# Patient Record
Sex: Female | Born: 1990 | Race: Black or African American | Hispanic: No | Marital: Single | Smoking: Never smoker
Health system: Southern US, Community
[De-identification: ages and names within clinical notes are randomized; demographics above are authoritative.]

## PROBLEM LIST (undated history)

## (undated) ENCOUNTER — Emergency Department (HOSPITAL_COMMUNITY): Admission: EM | Payer: 59 | Source: Home / Self Care

---

## 2015-10-18 ENCOUNTER — Encounter: Payer: Self-pay | Admitting: *Deleted

## 2015-11-18 ENCOUNTER — Ambulatory Visit: Payer: Self-pay | Admitting: Obstetrics & Gynecology

## 2016-12-09 ENCOUNTER — Emergency Department (HOSPITAL_COMMUNITY)
Admission: EM | Admit: 2016-12-09 | Discharge: 2016-12-09 | Disposition: A | Discharge status: Home / Self Care | Payer: 59 | Attending: Emergency Medicine | Admitting: Emergency Medicine

## 2016-12-09 ENCOUNTER — Encounter (HOSPITAL_COMMUNITY): Payer: Self-pay | Admitting: Emergency Medicine

## 2016-12-09 DIAGNOSIS — L02415 Cutaneous abscess of right lower limb: Secondary | ICD-10-CM | POA: Insufficient documentation

## 2016-12-09 DIAGNOSIS — L03115 Cellulitis of right lower limb: Secondary | ICD-10-CM

## 2016-12-09 DIAGNOSIS — L0291 Cutaneous abscess, unspecified: Secondary | ICD-10-CM

## 2016-12-09 LAB — CBC WITH DIFFERENTIAL/PLATELET
BASOS PCT: 0 %
Basophils Absolute: 0 10*3/uL (ref 0.0–0.1)
EOS ABS: 0.1 10*3/uL (ref 0.0–0.7)
Eosinophils Relative: 1 %
HCT: 35.9 % — ABNORMAL LOW (ref 36.0–46.0)
HEMOGLOBIN: 11.5 g/dL — AB (ref 12.0–15.0)
Lymphocytes Relative: 26 %
Lymphs Abs: 3.1 10*3/uL (ref 0.7–4.0)
MCH: 27.5 pg (ref 26.0–34.0)
MCHC: 32 g/dL (ref 30.0–36.0)
MCV: 85.9 fL (ref 78.0–100.0)
MONOS PCT: 9 %
Monocytes Absolute: 1 10*3/uL (ref 0.1–1.0)
NEUTROS PCT: 64 %
Neutro Abs: 7.7 10*3/uL (ref 1.7–7.7)
Platelets: 368 10*3/uL (ref 150–400)
RBC: 4.18 MIL/uL (ref 3.87–5.11)
RDW: 13.8 % (ref 11.5–15.5)
WBC: 12 10*3/uL — AB (ref 4.0–10.5)

## 2016-12-09 LAB — COMPREHENSIVE METABOLIC PANEL
ALBUMIN: 3.4 g/dL — AB (ref 3.5–5.0)
ALK PHOS: 46 U/L (ref 38–126)
ALT: 16 U/L (ref 14–54)
ANION GAP: 6 (ref 5–15)
AST: 17 U/L (ref 15–41)
BUN: 7 mg/dL (ref 6–20)
CO2: 25 mmol/L (ref 22–32)
Calcium: 9 mg/dL (ref 8.9–10.3)
Chloride: 106 mmol/L (ref 101–111)
Creatinine, Ser: 0.69 mg/dL (ref 0.44–1.00)
GFR calc Af Amer: >60 mL/min (ref >60)
GFR calc non Af Amer: >60 mL/min (ref >60)
GLUCOSE: 104 mg/dL — AB (ref 65–99)
POTASSIUM: 4.1 mmol/L (ref 3.5–5.1)
SODIUM: 137 mmol/L (ref 135–145)
Total Bilirubin: 0.3 mg/dL (ref 0.3–1.2)
Total Protein: 7.4 g/dL (ref 6.5–8.1)

## 2016-12-09 LAB — I-STAT BETA HCG BLOOD, ED (MC, WL, AP ONLY): I-stat hCG, quantitative: <5 m[IU]/mL (ref <5)

## 2016-12-09 MED ORDER — OXYCODONE-ACETAMINOPHEN 5-325 MG PO TABS
2.0000 | ORAL_TABLET | Freq: Once | ORAL | Status: DC
  Filled 2016-12-09: qty 2

## 2016-12-09 MED ORDER — OXYCODONE HCL 5 MG PO TABS
10.0000 mg | ORAL_TABLET | Freq: Once | ORAL | Status: AC
  Administered 2016-12-09: 10 mg via ORAL
  Filled 2016-12-09 (×2): qty 2

## 2016-12-09 MED ORDER — LIDOCAINE HCL (PF) 1 % IJ SOLN
2.0000 mL | Freq: Once | INTRAMUSCULAR | Status: AC
  Administered 2016-12-09: 2 mL
  Filled 2016-12-09: qty 5

## 2016-12-09 NOTE — ED Triage Notes (Signed)
Pt. reports right lateral  thigh pain with swelling from an insect bite onset last Friday .

## 2016-12-09 NOTE — Discharge Instructions (Signed)
You had an abscess in your right thigh.  This was opened and drained.  You should not eat any further treatment or intervention. As discussed please apply warm compress or stand in the shower and massage the area vigorously 2-3 times a day for the next 2-3 days, you can expect some bloody drainage for the next 24-48 hours.

## 2016-12-09 NOTE — ED Provider Notes (Signed)
[facility] Provider Note   CSN: [id number] Arrival date & time: 12/09/16  0034     History   Chief Complaint Chief Complaint  Patient presents with  . Cellulitis    HPI Erica Wagner is a 26 y.o. female.  This 26 year old female who reports that she went to the [place] last week, returning on Friday.  Since that time she has had a red sore area on her anterior right thigh that is getting worse.  She thinks she may of been bitten by something while she was on vacation.  Denies any generalized, fever, body aches.  States that she's been using Tylenol, ibuprofen for discomfort with no relief and applying ice packs      History reviewed. No pertinent past medical history.  There are no active problems to display for this patient.   History reviewed. No pertinent surgical history.  OB History    No data available       Home Medications    Prior to Admission medications   Not on File    Family History No family history on file.  Social History Social History  Substance Use Topics  . Smoking status: Never Smoker  . Smokeless tobacco: Never Used  . Alcohol use No     Allergies   Patient has no allergy information on record.   Review of Systems Review of Systems  Constitutional: Negative for chills and fever.  Skin: Positive for color change and wound.  All other systems reviewed and are negative.    Physical Exam Updated Vital Signs BP 139/96 (BP Location: Right Arm)   Pulse 103   Temp 98.1 F (36.7 C) (Oral)   Resp 18   Ht 5\' 6"  (1.676 m)   Wt 113.4 kg   LMP 11/17/2016 (Approximate)   SpO2 98%   BMI 40.35 kg/m   Physical Exam  Constitutional: She appears well-developed and well-nourished.  HENT:  Head: Normocephalic.  Eyes: Pupils are equal, round, and reactive to light.  Neck: Normal range of motion.  Cardiovascular: Normal rate.   Pulmonary/Chest: Effort normal.  Abdominal: Soft.  Musculoskeletal: Normal  range of motion. She exhibits tenderness.       Legs: Neurological: She is alert.  Skin: Skin is warm.  Psychiatric: She has a normal mood and affect.  Nursing note and vitals reviewed.    ED Treatments / Results  Labs (all labs ordered are listed, but only abnormal results are displayed) Labs Reviewed  CBC WITH DIFFERENTIAL/PLATELET - Abnormal; Notable for the following:       Result Value   WBC 12.0 (*)    Hemoglobin 11.5 (*)    HCT 35.9 (*)    All other components within normal limits  COMPREHENSIVE METABOLIC PANEL - Abnormal; Notable for the following:    Glucose, Bld 104 (*)    Albumin 3.4 (*)    All other components within normal limits  I-STAT BETA HCG BLOOD, ED ([facility], [facility], [facility] ONLY)    EKG  EKG Interpretation None       Radiology No results found.  Procedures .Marland KitchenIncision and Drainage Date/Time: 12/09/2016 3:43 AM Performed by: Earley Favor Authorized by: Earley Favor   Consent:    Consent obtained:  Verbal   Consent given by:  Patient   Risks discussed:  Bleeding, pain and infection   Alternatives discussed:  No treatment Location:    Type:  Abscess   Size:  6 cm   Location:  Lower extremity  Lower extremity location:  Leg   Leg location:  R upper leg Pre-procedure details:    Skin preparation:  Betadine Anesthesia (see MAR for exact dosages):    Anesthesia method:  Local infiltration   Local anesthetic:  Lidocaine 1% w/o epi Procedure type:    Complexity:  Simple Procedure details:    Needle aspiration: no     Incision types:  Single straight   Incision depth:  Subcutaneous   Scalpel blade:  11   Wound management:  Probed and deloculated   Drainage:  Purulent   Drainage amount:  Moderate   Wound treatment:  Wound left open   Packing materials:  None Post-procedure details:    Patient tolerance of procedure:  Tolerated well, no immediate complications   (including critical care time)  Medications Ordered in ED Medications  lidocaine (PF)  (XYLOCAINE) 1 % injection 2 mL (2 mLs Infiltration Given 12/09/16 0246)  oxyCODONE (Oxy IR/ROXICODONE) immediate release tablet 10 mg (10 mg Oral Given 12/09/16 0247)     Initial Impression / Assessment and Plan / ED Course  I have reviewed the triage vital signs and the nursing notes.  Pertinent labs & imaging results that were available during my care of the patient were reviewed by me and considered in my medical decision making (see chart for details).  Clinical Course     I&D performed  Final Clinical Impressions(s) / ED Diagnoses   Final diagnoses:  Cellulitis of right leg  Abscess    New Prescriptions New Prescriptions   No medications on file     Earley FavorGail Roosevelt Bisher, NP 12/09/16 0344    Earley FavorGail Naydelin Ziegler, NP 12/09/16 [phone]    Shon Batonourtney F Horton, MD 12/09/16 [phone]

## 2016-12-09 NOTE — ED Notes (Signed)
Pt verbalized understanding of d/c instructions and has no further questions. Pt is stable, A&Ox4, VSS.  

## 2019-01-27 ENCOUNTER — Ambulatory Visit (INDEPENDENT_AMBULATORY_CARE_PROVIDER_SITE_OTHER): Payer: PRIVATE HEALTH INSURANCE

## 2019-01-27 ENCOUNTER — Ambulatory Visit (HOSPITAL_COMMUNITY)
Admission: EM | Admit: 2019-01-27 | Discharge: 2019-01-27 | Disposition: A | Discharge status: Home / Self Care | Payer: PRIVATE HEALTH INSURANCE | Attending: Internal Medicine | Admitting: Internal Medicine

## 2019-01-27 ENCOUNTER — Telehealth (HOSPITAL_COMMUNITY): Payer: Self-pay | Admitting: Emergency Medicine

## 2019-01-27 ENCOUNTER — Encounter (HOSPITAL_COMMUNITY): Payer: Self-pay | Admitting: Emergency Medicine

## 2019-01-27 DIAGNOSIS — W19XXXA Unspecified fall, initial encounter: Secondary | ICD-10-CM | POA: Diagnosis not present

## 2019-01-27 DIAGNOSIS — S52121A Displaced fracture of head of right radius, initial encounter for closed fracture: Secondary | ICD-10-CM | POA: Diagnosis not present

## 2019-01-27 DIAGNOSIS — M25521 Pain in right elbow: Secondary | ICD-10-CM

## 2019-01-27 DIAGNOSIS — S59901A Unspecified injury of right elbow, initial encounter: Secondary | ICD-10-CM

## 2019-01-27 MED ORDER — MORPHINE SULFATE (PF) 2 MG/ML IV SOLN
2.0000 mg | Freq: Once | INTRAVENOUS | Status: AC
  Administered 2019-01-27: 2 mg via INTRAMUSCULAR

## 2019-01-27 MED ORDER — OXYCODONE-ACETAMINOPHEN 5-325 MG PO TABS: 1.0000 | ORAL_TABLET | ORAL | 0 refills | Status: DC | PRN

## 2019-01-27 MED ORDER — OXYCODONE-ACETAMINOPHEN 5-325 MG PO TABS: 1.0000 | ORAL_TABLET | ORAL | 0 refills | Status: AC | PRN

## 2019-01-27 MED ORDER — MORPHINE SULFATE (PF) 4 MG/ML IV SOLN
INTRAVENOUS | Status: AC
  Filled 2019-01-27: qty 1

## 2019-01-27 NOTE — ED Notes (Signed)
Ortho in room at this time.

## 2019-01-27 NOTE — Progress Notes (Signed)
Orthopedic Tech Progress Note Patient Details:  Erica Wagner [birth year] [id number]  Ortho Devices Type of Ortho Device: Post (long) splint, Arm sling Splint Material: Fiberglass Ortho Device/Splint Location: right arm Ortho Device/Splint Interventions: Adjustment, Application, Ordered   Post Interventions Patient Tolerated: Well Instructions Provided: Care of device, Adjustment of device   Donald Pore 01/27/2019, 9:57 AM

## 2019-01-27 NOTE — ED Provider Notes (Signed)
[facility]    CSN: [id number] Arrival date & time: 01/27/19  1937     History   Chief Complaint Chief Complaint  Patient presents with  . Arm Pain    HPI Erica Wagner is a 28 y.o. female with no past medical history comes to the urgent care department with acute onset of right nipple pain.  Pain is 10/10, aggravated by movement.  No relieving factors.  Patient has not tried any over-the-counter medications.  She denies any association with numbness or tingling.  Patient sustained a fall while was walking her pet sustained a mechanical fall.  She broke the fall on an extended wrist.  HPI  No past medical history on file.  There are no active problems to display for this patient.   No past surgical history on file.  OB History   No obstetric history on file.      Home Medications    Prior to Admission medications   Not on File    Family History No family history on file.  Social History Social History   Tobacco Use  . Smoking status: Never Smoker  . Smokeless tobacco: Never Used  Substance Use Topics  . Alcohol use: No  . Drug use: No     Allergies   Patient has no allergy information on record.   Review of Systems Review of Systems  Constitutional: Negative for activity change, appetite change and chills.  HENT: Negative for rhinorrhea, sinus pressure and sinus pain.   Eyes: Negative for pain, redness and itching.  Respiratory: Negative for chest tightness and shortness of breath.   Cardiovascular: Negative for chest pain.  Gastrointestinal: Negative for abdominal distention and abdominal pain.  Genitourinary: Negative for dysuria and flank pain.  Musculoskeletal: Positive for arthralgias. Negative for back pain, joint swelling, neck pain and neck stiffness.  Neurological: Negative for dizziness, weakness, light-headedness, numbness and headaches.  Psychiatric/Behavioral: Positive for self-injury. Negative for agitation,  confusion and decreased concentration.     Physical Exam Triage Vital Signs ED Triage Vitals  Enc Vitals Group     BP      Pulse      Resp      Temp      Temp src      SpO2      Weight      Height      Head Circumference      Peak Flow      Pain Score      Pain Loc      Pain Edu?      Excl. in GC?    No data found.  Updated Vital Signs There were no vitals taken for this visit.  Visual Acuity Right Eye Distance:   Left Eye Distance:   Bilateral Distance:    Right Eye Near:   Left Eye Near:    Bilateral Near:     Physical Exam Constitutional:      General: She is in acute distress.     Appearance: Normal appearance. She is not ill-appearing.  Eyes:     Extraocular Movements: Extraocular movements intact.  Neck:     Musculoskeletal: Normal range of motion and neck supple.  Pulmonary:     Effort: Pulmonary effort is normal.     Breath sounds: Normal breath sounds.  Abdominal:     General: Bowel sounds are normal. There is no distension.     Palpations: Abdomen is soft.     Tenderness: There is  no abdominal tenderness.  Musculoskeletal: Normal range of motion.        General: Tenderness and signs of injury present. No swelling.     Comments: Limited ROM right elbow  Skin:    General: Skin is warm.     Capillary Refill: Capillary refill takes less than 2 seconds.     Findings: No erythema or rash.  Neurological:     General: No focal deficit present.     Mental Status: She is alert and oriented to person, place, and time.      UC Treatments / Results  Labs (all labs ordered are listed, but only abnormal results are displayed) Labs Reviewed - No data to display  EKG None  Radiology No results found.  Procedures Procedures (including critical care time)  Medications Ordered in UC Medications - No data to display  Initial Impression / Assessment and Plan / UC Course  I have reviewed the triage vital signs and the nursing notes.  Pertinent  labs & imaging results that were available during my care of the patient were reviewed by me and considered in my medical decision making (see chart for details).     1.  Displaced intra-articular fracture of the right radial head: Orthopedic consult Right elbow splint Orthopedic surgery referral Morphine 4 mg IM Percocet at discharge  Final Clinical Impressions(s) / UC Diagnoses   Final diagnoses:  None   Discharge Instructions   None    ED Prescriptions    None     Controlled Substance Prescriptions [place] Controlled Substance Registry consulted? No   Merrilee Jansky, MD 01/27/19 [phone]

## 2019-01-27 NOTE — ED Triage Notes (Signed)
Pt presents to [facility] for assessment after being pulled down by her dog and catching herself with her arms.  C/o right elbow pain

## 2019-01-30 ENCOUNTER — Ambulatory Visit
Admission: RE | Admit: 2019-01-30 | Discharge: 2019-01-30 | Disposition: A | Discharge status: Home / Self Care | Payer: PRIVATE HEALTH INSURANCE | Source: Ambulatory Visit | Attending: Orthopedic Surgery | Admitting: Orthopedic Surgery

## 2019-01-30 ENCOUNTER — Other Ambulatory Visit: Payer: Self-pay | Admitting: Orthopedic Surgery

## 2019-01-30 DIAGNOSIS — M25521 Pain in right elbow: Secondary | ICD-10-CM

## 2019-02-02 ENCOUNTER — Other Ambulatory Visit: Payer: PRIVATE HEALTH INSURANCE

## 2020-10-02 IMAGING — DX DG ELBOW COMPLETE 3+V*R*
4 series · 4 of 4 positions shown · non-contrast
Comparison: None.

CLINICAL DATA: Fell this morning and injured elbow.

EXAM:
RIGHT ELBOW - COMPLETE 3+ VIEW

[elbow ap]
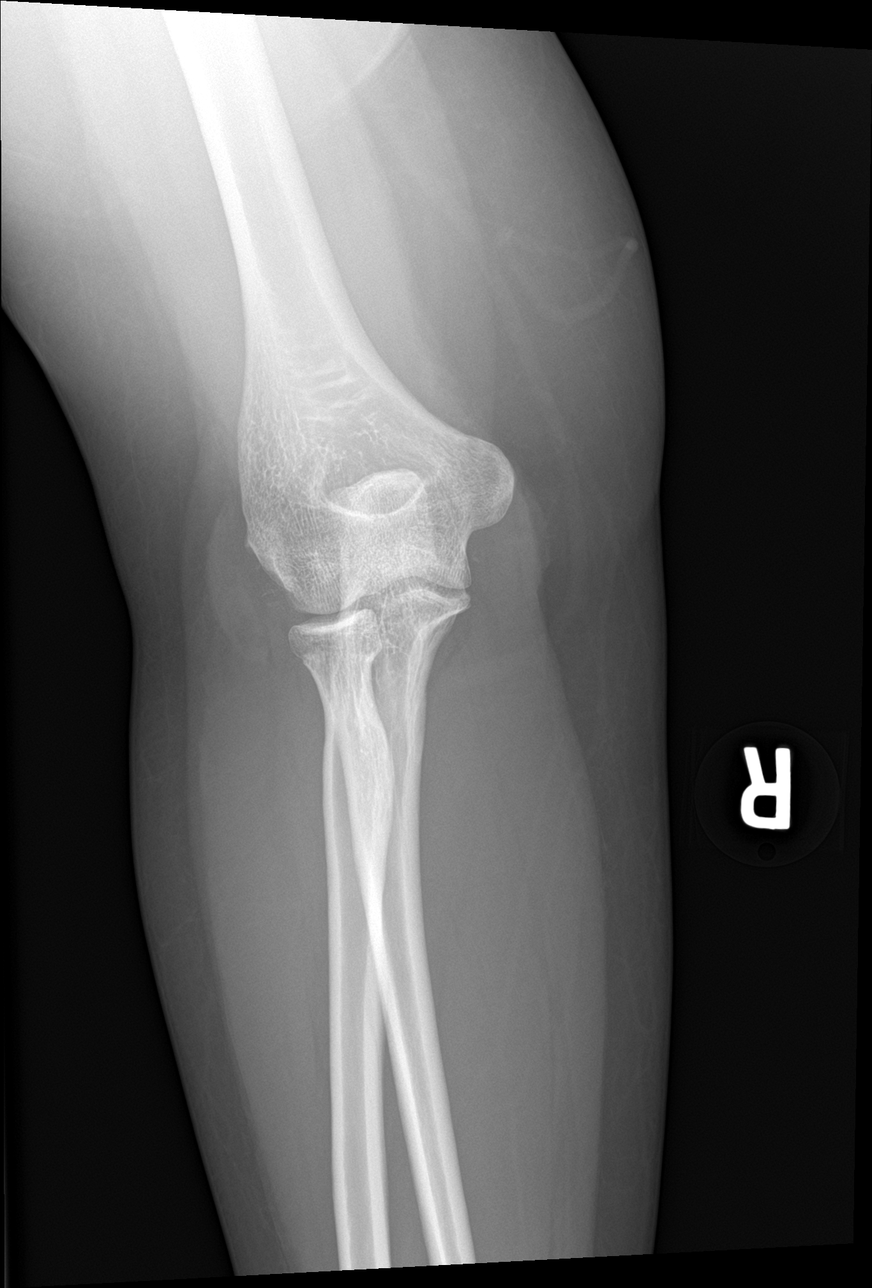

[elbow obl (1 of 2)]
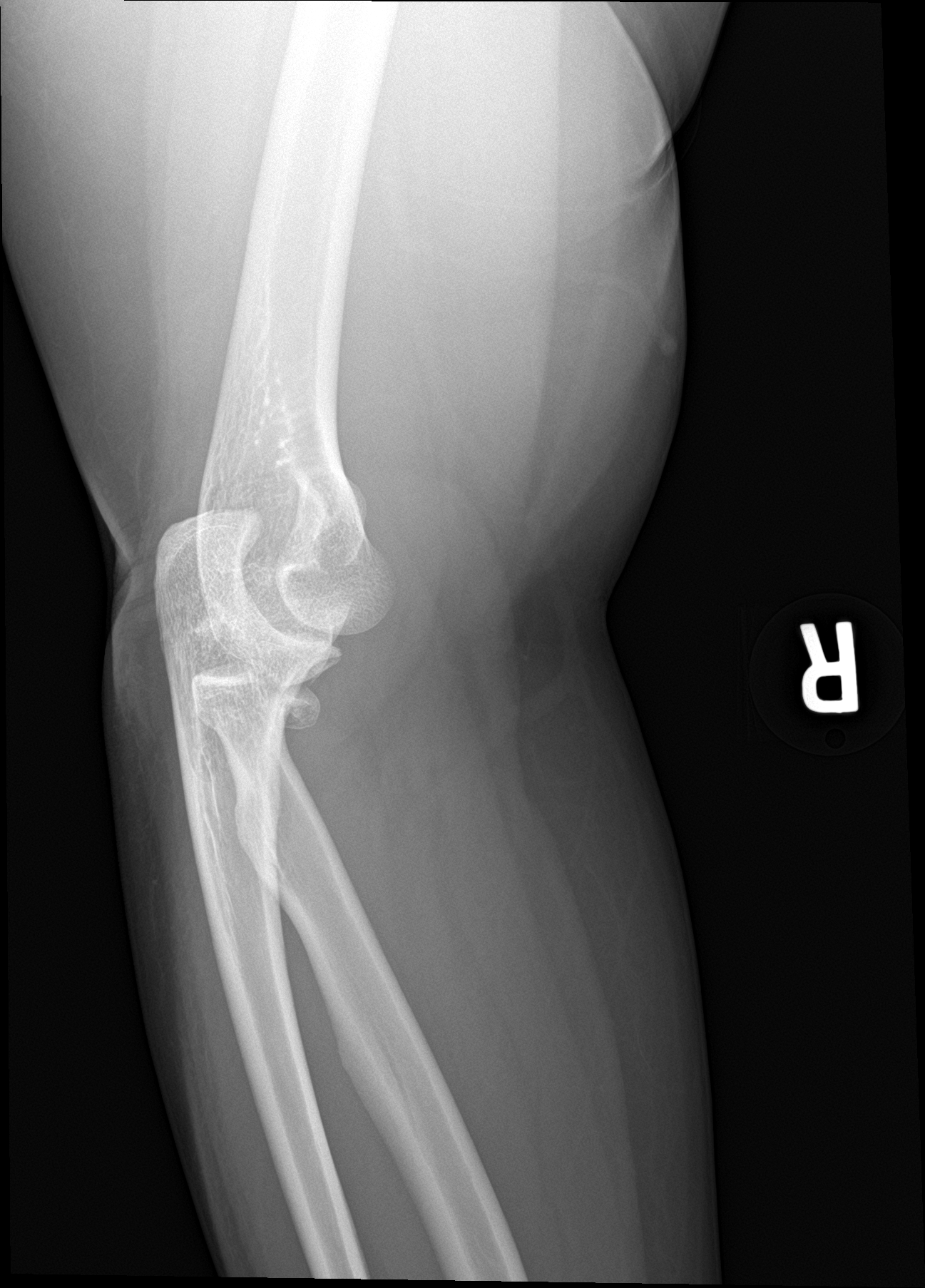

[elbow obl (2 of 2)]
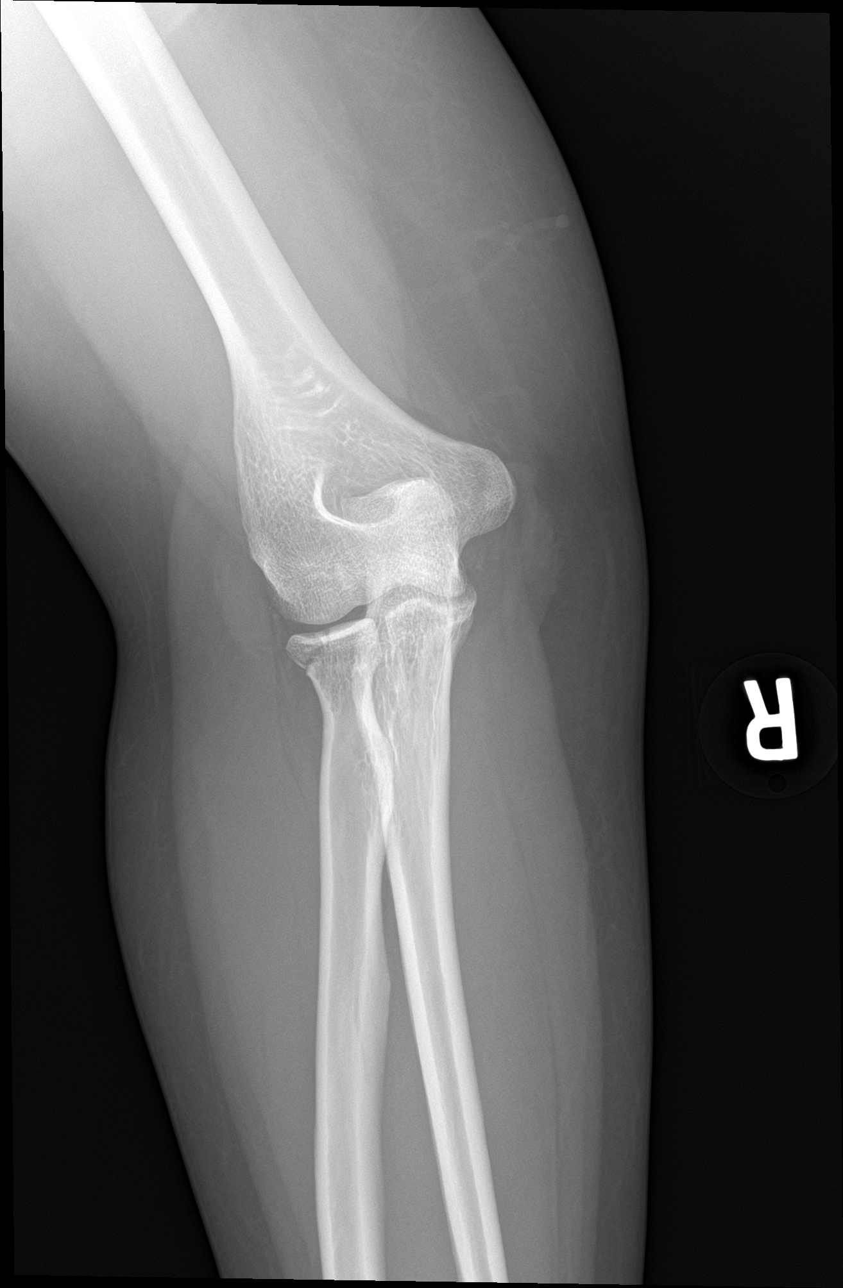

[elbow lat]
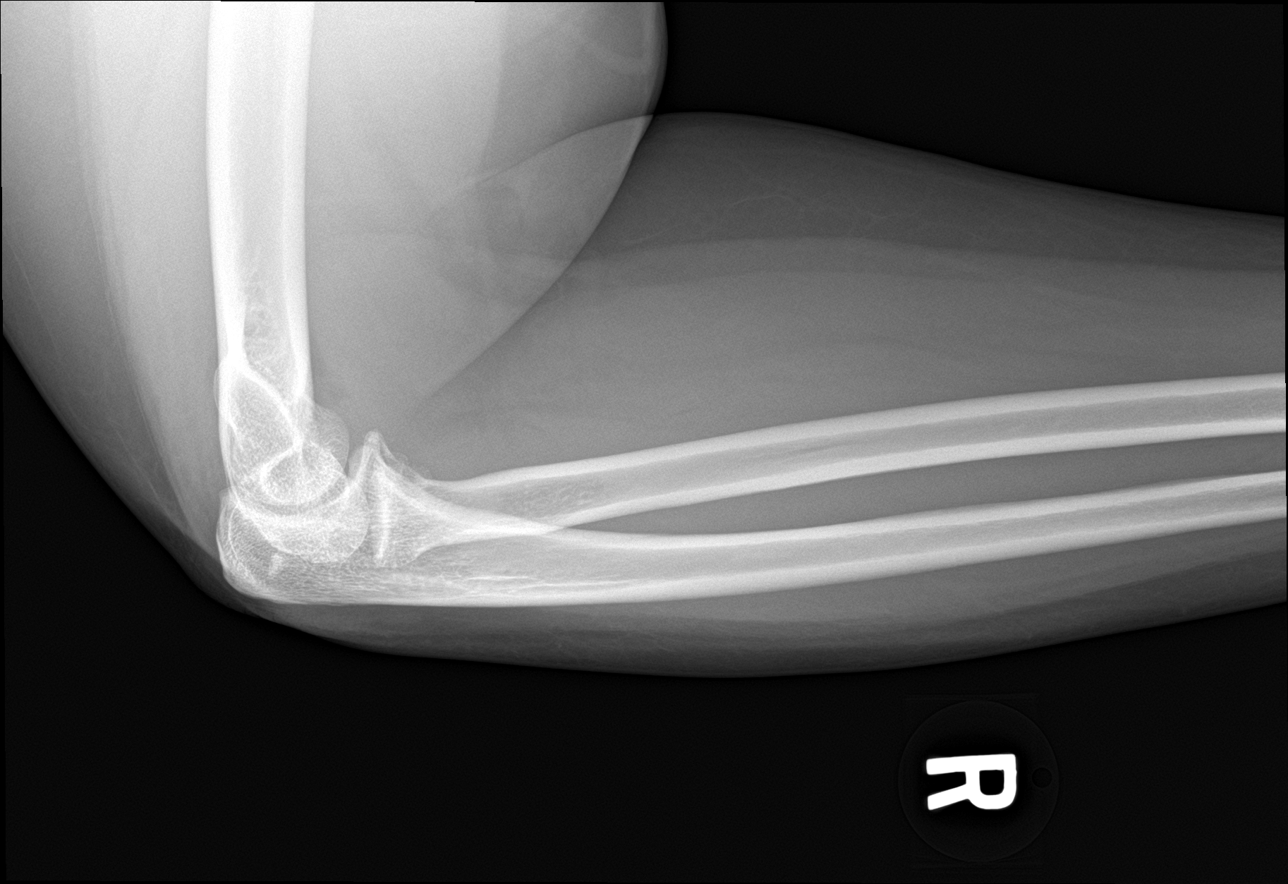

[4 of 4 positions shown; findings below may reference images not displayed]

FINDINGS: There is a displaced intra-articular fracture of the radial head.
This involves approximately 40% of the articular surface. No other
fractures are identified. There is an associated elbow joint
effusion.
IMPRESSION: Displaced intra-articular fracture of the radial head.
